# Patient Record
Sex: Female | Born: 1995 | Race: Black or African American | Hispanic: No | Marital: Single | Smoking: Never smoker
Health system: Southern US, Community
[De-identification: ages and names within clinical notes are randomized; demographics above are authoritative.]

## PROBLEM LIST (undated history)

## (undated) DIAGNOSIS — F32A Depression, unspecified: Secondary | ICD-10-CM

## (undated) DIAGNOSIS — J45909 Unspecified asthma, uncomplicated: Secondary | ICD-10-CM

## (undated) DIAGNOSIS — Z8249 Family history of ischemic heart disease and other diseases of the circulatory system: Secondary | ICD-10-CM

## (undated) DIAGNOSIS — F419 Anxiety disorder, unspecified: Secondary | ICD-10-CM

## (undated) DIAGNOSIS — Z8669 Personal history of other diseases of the nervous system and sense organs: Secondary | ICD-10-CM

## (undated) DIAGNOSIS — Z833 Family history of diabetes mellitus: Secondary | ICD-10-CM

## (undated) HISTORY — DX: Unspecified asthma, uncomplicated: J45.909

## (undated) HISTORY — DX: Depression, unspecified: F32.A

## (undated) HISTORY — DX: Personal history of other diseases of the nervous system and sense organs: Z86.69

## (undated) HISTORY — DX: Family history of ischemic heart disease and other diseases of the circulatory system: Z82.49

## (undated) HISTORY — DX: Anxiety disorder, unspecified: F41.9

## (undated) HISTORY — DX: Family history of diabetes mellitus: Z83.3

---

## 2021-02-19 ENCOUNTER — Encounter (HOSPITAL_BASED_OUTPATIENT_CLINIC_OR_DEPARTMENT_OTHER): Payer: Self-pay | Admitting: Nurse Practitioner

## 2021-02-19 ENCOUNTER — Other Ambulatory Visit (HOSPITAL_BASED_OUTPATIENT_CLINIC_OR_DEPARTMENT_OTHER): Admission: RE | Admit: 2021-02-19 | Payer: 59 | Source: Ambulatory Visit

## 2021-02-19 ENCOUNTER — Other Ambulatory Visit: Payer: Self-pay

## 2021-02-19 ENCOUNTER — Ambulatory Visit (HOSPITAL_BASED_OUTPATIENT_CLINIC_OR_DEPARTMENT_OTHER): Payer: 59 | Admitting: Nurse Practitioner

## 2021-02-19 VITALS — BP 97/61 | HR 82 | Ht 67.0 in | Wt 129.6 lb

## 2021-02-19 DIAGNOSIS — Z1329 Encounter for screening for other suspected endocrine disorder: Secondary | ICD-10-CM | POA: Insufficient documentation

## 2021-02-19 DIAGNOSIS — Z130 Encounter for screening for diseases of the blood and blood-forming organs and certain disorders involving the immune mechanism: Secondary | ICD-10-CM | POA: Insufficient documentation

## 2021-02-19 DIAGNOSIS — Z13228 Encounter for screening for other metabolic disorders: Secondary | ICD-10-CM

## 2021-02-19 DIAGNOSIS — Z7689 Persons encountering health services in other specified circumstances: Secondary | ICD-10-CM | POA: Insufficient documentation

## 2021-02-19 DIAGNOSIS — G4485 Primary stabbing headache: Secondary | ICD-10-CM

## 2021-02-19 DIAGNOSIS — Z113 Encounter for screening for infections with a predominantly sexual mode of transmission: Secondary | ICD-10-CM

## 2021-02-19 DIAGNOSIS — F419 Anxiety disorder, unspecified: Secondary | ICD-10-CM | POA: Insufficient documentation

## 2021-02-19 DIAGNOSIS — F32A Depression, unspecified: Secondary | ICD-10-CM | POA: Insufficient documentation

## 2021-02-19 DIAGNOSIS — Z0000 Encounter for general adult medical examination without abnormal findings: Secondary | ICD-10-CM

## 2021-02-19 DIAGNOSIS — Z1321 Encounter for screening for nutritional disorder: Secondary | ICD-10-CM

## 2021-02-19 LAB — HEPATITIS C ANTIBODY: HCV Ab: NONREACTIVE

## 2021-02-19 MED ORDER — SERTRALINE HCL 50 MG PO TABS: 50.0000 mg | ORAL_TABLET | Freq: Every day | ORAL | 3 refills | Status: DC

## 2021-02-19 NOTE — Patient Instructions (Addendum)
It sounds like you are having primary stabbing headaches (also called IcePick Headaches). These are not harmful and it appears that they are due to the nerves firing. It is thought that possibly having migraines makes you predisposed to these.   We will start with melatonin 54m at bedtime every night. If no improvement after one week, increase to 19mat bedtime every night. If you are still having the headaches after one week, then let me know and we will see if something a little stronger will work better for you.    We will start the sertraline (also called Zoloft) at 1/2 tab by mouth at bedtime for 4 days then increase to a full tab by mouth at bedtime. Take it every day so that it stays in your system and works best for you.   If the melatonin doesn't help you sleep, let me know and I will call in hydroxyzine for you to take as needed at bedtime.   If the sertraline is not working or you are having any side effects, let me know and we will change it up.     Information on diet, exercise, and health maintenance recommendations are listed below. This is information to help you be sure you are on track for optimal health and monitoring. Please look over this and let us[place]ow if you have any questions or if you have completed any of the health maintenance outside of [place] that we can be sure your records are up to date.  ___________________________________________________________  [facility]. I am excited for the opportunity to partner with you to meet your health care goals. It was a pleasure meeting you today!  I am an Adult-Geriatric Nurse Practitioner with a background in caring for patients for more than 20 years. I received my BaPaediatric nursen Nursing and my Doctor of Nursing Practice degrees [facility] [employer] received additional fellowship training in primary care and sports medicine after  receiving my doctorate degree. I provide primary care and sports medicine services to patients age 9957nd older within this office. I am also a provider with the [facility] the director of the APP Fellowship [facility] [place] called [place] for [place] 20 years and am proud to be a member of this community.   I am passionate about providing the best service to you through preventive medicine and supportive care. I consider you a part of the medical team and value your input. I work diligently to ensure that you are heard and your needs are met in a safe and effective manner. I want you to feel comfortable with me as your provider and want you to know that your health concerns are important to me.   For your information, our office hours are Monday- Friday 8:00 AM - 5:00 PM At this time I am not in the office on Wednesdays.  If you have questions or concerns, please call our office at [phone] send [place] MyChart message and we will respond as quickly as possible.   For all urgent or time sensitive needs we ask that you please call the office to avoid delays. MyChart is not constantly monitored and replies may take up to 72 business hours.  MyChart Policy: . MyChart allows for you to see your visit notes,  after visit summary, provider recommendations, lab and tests results, make an appointment, request refills, and contact your provider or the office for non-urgent questions or concerns.  . Providers are seeing patients during normal business hours and do not have built in time to review MyChart messages. We ask that you allow a minimum of 72 business hours for MyChart message responses.  . Complex MyChart concerns may require a visit. Your provider may request you schedule a virtual or in person visit to ensure we are providing the best care possible. . MyChart messages sent after 4:00 PM on  Friday will not be received by the provider until Monday morning.    Lab and Test Results: . You will receive your lab and test results on MyChart as soon as they are completed and results have been sent by the lab or testing facility. Due to this service, you will receive your results BEFORE your provider.  . Please allow a minimum of 72 business hours for your provider to receive and review lab and test results and contact you about.   . Most lab and test result comments from the provider will be sent through [facility]. Your provider may recommend changes to the plan of care, follow-up visits, repeat testing, ask questions, or request an office visit to discuss these results. You may reply directly to this message or call the office at [phone] to provide information for the provider or set up an appointment. . In some instances, you will be called with test results and recommendations. Please let us know if this is preferred and we will make note of this in your chart to provide this for you.    . If you have not heard a response to your lab or test results in 72 business hours, please call the office to let us know.   After Hours: . For all non-emergency after hours needs, please call the office at [phone] and select the option to reach the on-call provider service. On-call services are shared between multiple [facility] offices and therefore it will not be possible to speak directly with your provider. On-call providers may provide medical advice and recommendations, but are unable to provide refills for maintenance medications.  . For all emergency or urgent medical needs after normal business hours, we recommend that you seek care at the closest Urgent Care or Emergency Department to ensure appropriate treatment in a timely manner.  [facility] [place] has a 24 hour emergency room located on the ground floor for your convenience.    Please do not hesitate to reach out  to [place] with concerns.   Thank you, again, for choosing me as your health care partner. I appreciate your trust and look forward to learning more about you.   Worthy Keeler, DNP, AGNP-c ___________________________________________________________  Health Maintenance Recommendations Screening Testing  Mammogram  Every 1 -2 years based on history and risk factors  Starting at age 88  Pap Smear  Ages 21-39 every 3 years  Ages 38-65 every 5 years with HPV testing  More frequent testing may be required based on results and history  Colon Cancer Screening  Every 1-10 years based on test performed, risk factors, and history  Starting at age 65  Bone Density Screening  Every 2-10 years based on history  Starting at age 5 for women  Recommendations for men differ based on medication usage, history, and risk factors  AAA Screening  One time ultrasound  Men 65-75 years  old who have every smoked  Lung Cancer Screening  Low Dose Lung CT every 12 months  Age 42-80 years with a 30 pack-year smoking history who still smoke or who have quit within the last 15 years  Screening Labs  Routine  Labs: Complete Blood Count (CBC), Complete Metabolic Panel (CMP), Cholesterol (Lipid Panel)  Every 6-12 months based on history and medications  May be recommended more frequently based on current conditions or previous results  Hemoglobin A1c Lab  Every 3-12 months based on history and previous results  Starting at age 28 or earlier with diagnosis of diabetes, high cholesterol, BMI >26, and/or risk factors  Frequent monitoring for patients with diabetes to ensure blood sugar control  Thyroid Panel (TSH w/ T3 & T4)  Every 6 months based on history, symptoms, and risk factors  May be repeated more often if on medication  HIV  One time testing for all patients 50 and older  May be repeated more frequently for patients with increased risk factors or exposure  Hepatitis  C  One time testing for all patients 27 and older  May be repeated more frequently for patients with increased risk factors or exposure  Gonorrhea, Chlamydia  Every 12 months for all sexually active persons 13-24 years  Additional monitoring may be recommended for those who are considered high risk or who have symptoms  PSA  Men 44-42 years old with risk factors  Additional screening may be recommended from age 53-69 based on risk factors, symptoms, and history  Vaccine Recommendations  Tetanus Booster  All adults every 10 years  Flu Vaccine  All patients 6 months and older every year  COVID Vaccine  All patients 12 years and older  Initial dosing with booster  May recommend additional booster based on age and health history  HPV Vaccine  2 doses all patients age 8-26  Dosing may be considered for patients over 26  Shingles Vaccine (Shingrix)  2 doses all adults 59 years and older  Pneumonia (Pneumovax 23)  All adults 75 years and older  May recommend earlier dosing based on health history  Pneumonia (Prevnar 33)  All adults 76 years and older  Dosed 1 year after Pneumovax 23  Additional Screening, Testing, and Vaccinations may be recommended on an individualized basis based on family history, health history, risk factors, and/or exposure.  __________________________________________________________  Diet Recommendations for All Patients  I recommend that all patients maintain a diet low in saturated fats, carbohydrates, and cholesterol. While this can be challenging at first, it is not impossible and small changes can make big differences.  Things to try: Marland Kitchen Decreasing the amount of soda, sweet tea, and/or juice to one or less per day and replace with water o While water is always the first choice, if you do not like water you may consider - adding a water additive without sugar to improve the taste - other sugar free drinks . Replace potatoes with  a brightly colored vegetable at dinner . Use healthy oils, such as canola oil or olive oil, instead of butter or hard margarine . Limit your bread intake to two pieces or less a day . Replace regular pasta with low carb pasta options . Bake, broil, or grill foods instead of frying . Monitor portion sizes  . Eat smaller, more frequent meals throughout the day instead of large meals  An important thing to remember is, if you love foods that are not great for your health, you don't  have to give them up completely. Instead, allow these foods to be a reward when you have done well. Allowing yourself to still have special treats every once in a while is a nice way to tell yourself thank you for working hard to keep yourself healthy.   Also remember that every day is a new day. If you have a bad day and "fall off the wagon", you can still climb right back up and keep moving along on your journey!  We have resources available to help you!  Some websites that may be helpful include: . www.http://carter.biz/  . Www.VeryWellFit.com _____________________________________________________________  Activity Recommendations for All Patients  I recommend that all adults get at least 20 minutes of moderate physical activity that elevates your heart rate at least 5 days out of the week.  Some examples include: . Walking or jogging at a pace that allows you to carry on a conversation . Cycling (stationary bike or outdoors) . Water aerobics . Yoga . Weight lifting . Dancing If physical limitations prevent you from putting stress on your joints, exercise in a pool or seated in a chair are excellent options.  Do determine your MAXIMUM heart rate for activity: YOUR AGE - 220 = MAX HeartRate   Remember! . Do not push yourself too hard.  . Start slowly and build up your pace, speed, weight, time in exercise, etc.  . Allow your body to rest between exercise and get good sleep. . You will need more water than normal  when you are exerting yourself. Do not wait until you are thirsty to drink. Drink with a purpose of getting in at least 8, 8 ounce glasses of water a day plus more depending on how much you exercise and sweat.    If you begin to develop dizziness, chest pain, abdominal pain, jaw pain, shortness of breath, headache, vision changes, lightheadedness, or other concerning symptoms, stop the activity and allow your body to rest. If your symptoms are severe, seek emergency evaluation immediately. If your symptoms are concerning, but not severe, please let us know so that we can recommend further evaluation.   ________________________________________________________________
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
## 2021-02-21 ENCOUNTER — Encounter (HOSPITAL_BASED_OUTPATIENT_CLINIC_OR_DEPARTMENT_OTHER): Payer: Self-pay | Admitting: Nurse Practitioner

## 2021-02-21 DIAGNOSIS — Z0000 Encounter for general adult medical examination without abnormal findings: Secondary | ICD-10-CM | POA: Insufficient documentation

## 2021-02-21 DIAGNOSIS — Z113 Encounter for screening for infections with a predominantly sexual mode of transmission: Secondary | ICD-10-CM | POA: Insufficient documentation

## 2021-02-21 DIAGNOSIS — F32A Depression, unspecified: Secondary | ICD-10-CM | POA: Insufficient documentation

## 2021-02-21 DIAGNOSIS — Z13228 Encounter for screening for other metabolic disorders: Secondary | ICD-10-CM | POA: Insufficient documentation

## 2021-02-21 DIAGNOSIS — Z130 Encounter for screening for diseases of the blood and blood-forming organs and certain disorders involving the immune mechanism: Secondary | ICD-10-CM | POA: Insufficient documentation

## 2021-02-21 DIAGNOSIS — G4485 Primary stabbing headache: Secondary | ICD-10-CM | POA: Insufficient documentation

## 2021-02-21 DIAGNOSIS — Z7689 Persons encountering health services in other specified circumstances: Secondary | ICD-10-CM | POA: Insufficient documentation

## 2021-02-21 NOTE — Assessment & Plan Note (Signed)
Assessment: GAD7, 12 and PHQ-9, 13 today Sx consistent with worsening anxiety and depression No signs of SI/HI/Self harm today Discussion/Recommendations: Medication and therapy very effective for management of symptoms- recommend both for treatment Action of medication explained Side effects of medication explained Expectations for sx to improve explained Sx of depression may worsen initially, notify IMMEDIATELY if thoughts of self-harm or suicide occur Resources discussed Plan: Start sertraline 25mg  by mouth for 4 days then increase to 50mg  by mouth daily- bedtime administration helps with sleepiness at start of medication Utilize melatonin 5mg -12mg  for sleep and headache prevention F/U in 4 weeks or sooner, if needed

## 2021-02-21 NOTE — Assessment & Plan Note (Addendum)
Assessment: Normal PE with no concerning findings. Depression and anxiety symptoms present. Discussion/Recommendations: Labs today - will check A1c Diet and exercise recommendations provided Pap smear every 3 years until 30.  HPV vaccines help to prevent against certain viruses causing cervical and throat cancers- no sx may be present- 3 dose series recommended Flu shot annually, COVID booster, and 2 HPV vaccines needed Plan: STI testing and basic labs F/U for CPE in 1 year Flu shot in the fall Will think about pap- consider offering anxiolytic for procedure.

## 2021-02-21 NOTE — Assessment & Plan Note (Signed)
Assessment: Intermittent short lasting headache with severe stabbing quality to the top of the head with bilateral involvement.  No neurological deficits, vision changes, or warning symptoms present on exam. No warning sx present with episodes per pt. Recommendations/Discussion: Consistent with primary stabbing headache Cause of these are unknown, but often seen in females with history of migraines Treatment options include melatonin 5-12mg  at bedtime and/or indomethacin for prevention Monitor for weakness, dizziness, changes in mental status, difficulty walking/talking/swallowing, loss of consciousness and seek emergency care if these develop. Report these or any new symptoms immediately Plan: Start melatonin 5mg  at bedtime nightly- may increase up to 12mg - to prevent headaches If headaches still occur, please let me know F/U in 4 weeks for evaluation of treatment.

## 2021-02-21 NOTE — Assessment & Plan Note (Signed)
Review of current and past medical history, social history, medication, and family history.  Review of care gaps and health maintenance recommendations.  Records from recent providers to be requested if not available in Chart Review or Care Everywhere.  Recommendations for health maintenance, diet, and exercise provided.  Will determine if tdap is due.  Pt to think about HPV

## 2021-03-04 ENCOUNTER — Telehealth (HOSPITAL_BASED_OUTPATIENT_CLINIC_OR_DEPARTMENT_OTHER): Payer: Self-pay

## 2021-03-04 NOTE — Telephone Encounter (Signed)
-----   Message from Tollie Eth, NP sent at 03/04/2021 12:07 PM EDT ----- Hep C non-reactive.  No other results received.

## 2021-03-04 NOTE — Telephone Encounter (Signed)
Results released by Shawna Clamp, AGNP.  Called patient to discuss lab results.  Instructed patient to contact the office with any questions or concerns.

## 2021-03-04 NOTE — Progress Notes (Signed)
Hep C non-reactive.  No other results received.

## 2021-03-19 ENCOUNTER — Other Ambulatory Visit: Payer: Self-pay

## 2021-03-19 ENCOUNTER — Telehealth (INDEPENDENT_AMBULATORY_CARE_PROVIDER_SITE_OTHER): Payer: 59 | Admitting: Nurse Practitioner

## 2021-03-19 ENCOUNTER — Encounter (HOSPITAL_BASED_OUTPATIENT_CLINIC_OR_DEPARTMENT_OTHER): Payer: Self-pay | Admitting: Nurse Practitioner

## 2021-03-19 VITALS — Ht 67.0 in | Wt 128.8 lb

## 2021-03-19 DIAGNOSIS — F419 Anxiety disorder, unspecified: Secondary | ICD-10-CM

## 2021-03-19 DIAGNOSIS — F32A Depression, unspecified: Secondary | ICD-10-CM | POA: Diagnosis not present

## 2021-03-19 DIAGNOSIS — G4700 Insomnia, unspecified: Secondary | ICD-10-CM | POA: Diagnosis not present

## 2021-03-19 MED ORDER — HYDROXYZINE HCL 25 MG PO TABS: ORAL_TABLET | ORAL | 3 refills | Status: DC

## 2021-03-19 NOTE — Patient Instructions (Signed)
Lets plan to give the sertraline another 2 weeks to see if the sleepiness starts to wear off. You can also try taking the sertraline in the morning to see if there is a difference when dosing it this way.   Send me a message on MyChart if it is not any better.   I have sent in hydroxyzine for you to the pharmacy. You can take this about 30 minutes before bedtime- take 1 pill and if you don't feel the effects, you can take another after about 20 minutes. If you find that you are always needing 2 pills, it is ok to go ahead and take 2 each night. I sent enough medication to the pharmacy so you can do that.   If this doesn't work for you, please let me know.  If all is working well, we will plan to meet again in about 3 months to make sure that you are still feeling OK on the medication and make sure we don't need to make any changes.   If you have any questions or concerns, dont hesitate to reach out.

## 2021-03-19 NOTE — Progress Notes (Signed)
Virtual Video Visit via MyChart Note  I connected with  Mikayla Ortega on 03/19/21 at  1:10 PM EDT by the video enabled telemedicine application for , MyChart, and verified that I am speaking with the correct person using two identifiers.   I introduced myself as a Publishing rights manager with the practice. We discussed the limitations of evaluation and management by telemedicine and the availability of in person appointments. The patient expressed understanding and agreed to proceed.  Participating parties in this visit include: The patient and the nurse practitioner listed.  The patient is: At home I am: In the office  Subjective:    CC:  Chief Complaint  Patient presents with   Follow-up    Patient  states she has trouble sleeping at night; however, is experiencing sleepiness and being tired throughout the day.    HPI: Mikayla Ortega is a 25 y.o. year old female presenting today via MyChart today for F/U on mood and sleep.  At last visit was started on sertraline 50mg  daily and melatonin 5mg  at bedtime. She reports that she likes the sertraline    Past medical history, Surgical history, Family history not pertinant except as noted below, Social history, Allergies, and medications have been entered into the medical record, reviewed, and corrections made.   Review of Systems:  All review of systems negative except what is listed in the HPI   Objective:    General:  Speaking clearly in complete sentences. Absent shortness of breath noted.   Alert and oriented x3.   Normal judgment.  Absent acute distress.   Impression and Recommendations:    1. Anxiety and depression  2. Insomnia, unspecified type - hydrOXYzine (ATARAX/VISTARIL) 25 MG tablet; Take 1 - 2 tabs (25mg  - 50mg ) 30 minutes before bedtime for sleep.  Dispense: 60 tablet; Refill: 3  PLAN: Continue sertraline 50mg  per day Start Hydroxyzine 25-50mg  at bedtime for sleep.   Lets plan to give the sertraline  another 2 weeks to see if the sleepiness starts to wear off. You can also try taking the sertraline in the morning to see if there is a difference when dosing it this way.  Send me a message on MyChart if it is not any better.   I have sent in hydroxyzine for you to the pharmacy. You can take this about 30 minutes before bedtime- take 1 pill and if you don't feel the effects, you can take another after about 20 minutes. If you find that you are always needing 2 pills, it is ok to go ahead and take 2 each night. I sent enough medication to the pharmacy so you can do that.  If this doesn't work for you, please let me know.  If all is working well, we will plan to meet again in about 3 months to make sure that you are still feeling OK on the medication and make sure we don't need to make any changes.   If you have any questions or concerns, dont hesitate to reach out.     I discussed the assessment and treatment plan with the patient. The patient was provided an opportunity to ask questions and all were answered. The patient agreed with the plan and demonstrated an understanding of the instructions.   The patient was advised to call back or seek an in-person evaluation if the symptoms worsen or if the condition fails to improve as anticipated.  F/U in 3 months I provided 16 minutes of non-face-to-face interaction with  this MYCHART visit including intake, same-day documentation, and chart review.   Tollie Eth, NP

## 2021-03-31 ENCOUNTER — Encounter (HOSPITAL_BASED_OUTPATIENT_CLINIC_OR_DEPARTMENT_OTHER): Payer: Self-pay | Admitting: Nurse Practitioner

## 2021-06-13 ENCOUNTER — Encounter (HOSPITAL_BASED_OUTPATIENT_CLINIC_OR_DEPARTMENT_OTHER): Payer: Self-pay | Admitting: Nurse Practitioner

## 2021-06-13 DIAGNOSIS — N926 Irregular menstruation, unspecified: Secondary | ICD-10-CM

## 2021-06-23 ENCOUNTER — Encounter (HOSPITAL_BASED_OUTPATIENT_CLINIC_OR_DEPARTMENT_OTHER): Payer: Self-pay | Admitting: Nurse Practitioner

## 2021-06-23 ENCOUNTER — Other Ambulatory Visit: Payer: Self-pay

## 2021-06-23 ENCOUNTER — Ambulatory Visit (HOSPITAL_BASED_OUTPATIENT_CLINIC_OR_DEPARTMENT_OTHER): Payer: Managed Care, Other (non HMO) | Admitting: Nurse Practitioner

## 2021-06-23 VITALS — BP 107/72 | HR 88 | Ht 67.0 in | Wt 125.4 lb

## 2021-06-23 DIAGNOSIS — M542 Cervicalgia: Secondary | ICD-10-CM | POA: Diagnosis not present

## 2021-06-23 DIAGNOSIS — N926 Irregular menstruation, unspecified: Secondary | ICD-10-CM

## 2021-06-23 MED ORDER — PREDNISONE 20 MG PO TABS
20.0000 mg | ORAL_TABLET | Freq: Every day | ORAL | 0 refills | Status: DC
Start: 2021-06-23 — End: 2021-12-17

## 2021-06-23 MED ORDER — CYCLOBENZAPRINE HCL 5 MG PO TABS: 5.0000 mg | ORAL_TABLET | Freq: Three times a day (TID) | ORAL | 1 refills | Status: DC | PRN
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
## 2021-06-23 NOTE — Assessment & Plan Note (Deleted)
Mikayla Ortega is agreeable to monitor the symptoms at this time. She will call back and let us know if her next cycle is longer than her usual cycle of 6 days. We discussed taking Provera to help stop the cycle at that time.

## 2021-06-23 NOTE — Assessment & Plan Note (Addendum)
Right sided neck pain affecting sleep.  No known injury. No alarm sx present.  Will proceed with cervical x-ray and steroid burst today with at home exercises.  Script for flexeril provided . Will make changes to plan of care as necessary based on results.  F/U if sx worsen or fail to improve.
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
## 2021-06-23 NOTE — Assessment & Plan Note (Addendum)
Prolonged menses with no known provocation.  No tenderness present on exam. BS normal. No constipation.  Suspect symptoms are transient and discussed option for transvaginal US vs OCP vs watchful waiting.  She would like to monitor her cycle and will let us know if the bleeding lasts longer than 6-7 days. At that time, will consider 10mg  Provera for 10 days to help reset cycle.

## 2021-06-23 NOTE — Patient Instructions (Addendum)
We have sent a referral for an x-ray of your neck.  [address] [place].  [address]  [phone]  We have sent your prescription of predisone 20 mg for five days. We have also sent in Flexeril 5mg . You can take 1/2 to 1 tab as needed for severe pain.   Work on stretching exercises at least 3 times a day and continue to use ice and heat to the area to help with pain and swelling,   Let me know if this does not get better

## 2021-07-08 ENCOUNTER — Ambulatory Visit
Admission: RE | Admit: 2021-07-08 | Discharge: 2021-07-08 | Disposition: A | Discharge status: Home / Self Care | Payer: Managed Care, Other (non HMO) | Source: Ambulatory Visit | Attending: Nurse Practitioner | Admitting: Nurse Practitioner

## 2021-07-08 DIAGNOSIS — M542 Cervicalgia: Secondary | ICD-10-CM

## 2021-09-24 ENCOUNTER — Ambulatory Visit (HOSPITAL_BASED_OUTPATIENT_CLINIC_OR_DEPARTMENT_OTHER): Payer: Managed Care, Other (non HMO) | Admitting: Nurse Practitioner

## 2021-10-06 ENCOUNTER — Ambulatory Visit (HOSPITAL_BASED_OUTPATIENT_CLINIC_OR_DEPARTMENT_OTHER): Payer: Managed Care, Other (non HMO) | Admitting: Nurse Practitioner

## 2021-10-06 NOTE — Progress Notes (Deleted)
Patient not seen.

## 2021-10-15 ENCOUNTER — Encounter (HOSPITAL_BASED_OUTPATIENT_CLINIC_OR_DEPARTMENT_OTHER): Payer: Self-pay | Admitting: Nurse Practitioner

## 2021-12-15 NOTE — Progress Notes (Signed)
? ?  GYNECOLOGY OFFICE VISIT NOTE ? ?History:  ? Mikayla Ortega is a 26 y.o. G0 here today for - August 3 wk period. Normal ever since. No other symptoms. No cramping. It was lighter than her normal period. She had some time and that resumed.   ? ?No pain with sexual activity. Sexually active with women. Had pain with orgasm some time ago but none since (4 months ago). She uses pads.  ? ?Got some thermacare on that area and caused some discomfort. Took OTC meds and it helped.  ?  ?Past Medical History:  ?Diagnosis Date  ? Anxiety   ? Asthma   ? In childhood- resolved  ? Depression   ? Family history of cardiovascular disease   ? Family history of diabetes mellitus (DM)   ? History of migraine headaches   ? ? ?History reviewed. No pertinent surgical history. ? ?The following portions of the patient's history were reviewed and updated as appropriate: allergies, current medications, past family history, past medical history, past social history, past surgical history and problem list.  ? ?Health Maintenance:   ?No pap on file.  ? ?Review of Systems:  ?Pertinent items noted in HPI and remainder of comprehensive ROS otherwise negative. ? ?Physical Exam:  ?BP 116/78   Pulse 94   Ht 5\' 7"  (1.702 m)   Wt 123 lb (55.8 kg)   LMP 11/25/2021   BMI 19.26 kg/m?  ?CONSTITUTIONAL: Well-developed, well-nourished female in no acute distress.  ?HEENT:  Normocephalic, atraumatic. External right and left ear normal. No scleral icterus.  ?NECK: Normal range of motion, supple, no masses noted on observation ?SKIN: No rash noted. Not diaphoretic. No erythema. No pallor. ?MUSCULOSKELETAL: Normal range of motion. No edema noted. ?NEUROLOGIC: Alert and oriented to person, place, and time. Normal muscle tone coordination. No cranial nerve deficit noted. ?PSYCHIATRIC: Normal mood and affect. Normal behavior. Normal judgment and thought content. ? ?CARDIOVASCULAR: Normal heart rate noted ?RESPIRATORY: Effort and breath sounds normal, no  problems with respiration noted ?ABDOMEN: No masses noted. No other overt distention noted.   ? ?PELVIC: Normal appearing external genitalia; normal urethral meatus; normal appearing vaginal mucosa. Exam limited by hymenal tissue - only limited speculum exam done to collect pap and cultures. Performed in the presence of a chaperone ? ?Labs and Imaging ?No results found for this or any previous visit (from the past 168 hour(s)). ?No results found.  ?Assessment and Plan:  ? Mikayla Ortega was seen today for menstrual problem. ? ?Diagnoses and all orders for this visit: ? ?Abnormal uterine bleeding ?-     TSH ?-     Cytology - PAP( [facility]) ?-     Cervicovaginal ancillary only( ) ? ?- Bleeding is self limited and has seemed to resolve. Will do basic work up but she is otherwise low risk for anything abnormal. Pt will contact [facility] if it becomes a recurrent issue.  ?- Pap and cultures ?- [facility] d/t [facility]  ?- Gardasil #1 done in 2018. She will consider 2 more doses. Information given on Gardasil for her consideration.  ? ? ?Routine preventative health maintenance measures emphasized. ?Please refer to After Visit Summary for other counseling recommendations.  ? ?No follow-ups on file. ? ?2019, MD, FACOG ?Obstetrician Mil[facility] Hock, Faculty Practice ?[facility], [facility] ? ? ? ? ? ?

## 2021-12-16 ENCOUNTER — Telehealth: Payer: Self-pay | Admitting: *Deleted

## 2021-12-16 NOTE — Telephone Encounter (Signed)
Left patient an urgent message with all office information for scheduled appointment on 12/16/2021 at 2:10 with an 1:55 PM arrival time. ?

## 2021-12-17 ENCOUNTER — Ambulatory Visit: Payer: Managed Care, Other (non HMO) | Admitting: Obstetrics and Gynecology

## 2021-12-17 ENCOUNTER — Encounter: Payer: Self-pay | Admitting: Obstetrics and Gynecology

## 2021-12-17 ENCOUNTER — Other Ambulatory Visit (HOSPITAL_COMMUNITY)
Admission: RE | Admit: 2021-12-17 | Discharge: 2021-12-17 | Disposition: A | Discharge status: Home / Self Care | Payer: Managed Care, Other (non HMO) | Source: Ambulatory Visit | Attending: Obstetrics and Gynecology | Admitting: Obstetrics and Gynecology

## 2021-12-17 ENCOUNTER — Other Ambulatory Visit: Payer: Self-pay

## 2021-12-17 VITALS — BP 116/78 | HR 94 | Ht 67.0 in | Wt 123.0 lb

## 2021-12-17 DIAGNOSIS — N939 Abnormal uterine and vaginal bleeding, unspecified: Secondary | ICD-10-CM | POA: Insufficient documentation

## 2021-12-17 NOTE — Progress Notes (Signed)
Pt had a period from 06/01/21-06/20/21 ?Pt has had regular periods since ?Pt has never had pap smear ?

## 2021-12-18 LAB — CYTOLOGY - PAP: Diagnosis: NEGATIVE

## 2021-12-18 LAB — CERVICOVAGINAL ANCILLARY ONLY
Bacterial Vaginitis (gardnerella): POSITIVE — AB
Candida Glabrata: NEGATIVE
Candida Vaginitis: NEGATIVE
Chlamydia: NEGATIVE
Comment: NEGATIVE
Comment: NEGATIVE
Comment: NEGATIVE
Comment: NEGATIVE
Comment: NEGATIVE
Comment: NORMAL
Neisseria Gonorrhea: NEGATIVE
Trichomonas: NEGATIVE

## 2021-12-19 ENCOUNTER — Encounter: Payer: Self-pay | Admitting: Obstetrics and Gynecology

## 2021-12-21 ENCOUNTER — Other Ambulatory Visit: Payer: Self-pay | Admitting: *Deleted

## 2021-12-21 MED ORDER — METRONIDAZOLE 500 MG PO TABS: 500.0000 mg | ORAL_TABLET | Freq: Two times a day (BID) | ORAL | 0 refills | Status: DC

## 2021-12-21 NOTE — Progress Notes (Signed)
Pt is positive for BV and RX sent to her pharmacy.  Pt also wanting to know how to keep Ph in balance.  Probiotic daily was suggested. ?

## 2022-03-15 ENCOUNTER — Telehealth (HOSPITAL_BASED_OUTPATIENT_CLINIC_OR_DEPARTMENT_OTHER): Payer: Managed Care, Other (non HMO) | Admitting: Nurse Practitioner

## 2022-03-16 ENCOUNTER — Encounter (HOSPITAL_BASED_OUTPATIENT_CLINIC_OR_DEPARTMENT_OTHER): Payer: Self-pay | Admitting: Nurse Practitioner

## 2022-03-16 MED ORDER — SERTRALINE HCL 50 MG PO TABS: 50.0000 mg | ORAL_TABLET | Freq: Every day | ORAL | 3 refills | Status: DC

## 2022-03-25 ENCOUNTER — Telehealth (INDEPENDENT_AMBULATORY_CARE_PROVIDER_SITE_OTHER): Payer: Managed Care, Other (non HMO) | Admitting: Nurse Practitioner

## 2022-03-25 ENCOUNTER — Encounter (HOSPITAL_BASED_OUTPATIENT_CLINIC_OR_DEPARTMENT_OTHER): Payer: Self-pay

## 2022-03-25 DIAGNOSIS — F419 Anxiety disorder, unspecified: Secondary | ICD-10-CM | POA: Diagnosis not present

## 2022-03-25 DIAGNOSIS — F32A Depression, unspecified: Secondary | ICD-10-CM | POA: Diagnosis not present

## 2022-03-25 MED ORDER — SERTRALINE HCL 100 MG PO TABS: 100.0000 mg | ORAL_TABLET | Freq: Every day | ORAL | 3 refills | Status: DC

## 2022-03-25 MED ORDER — ALPRAZOLAM 0.25 MG PO TABS: 0.2500 mg | ORAL_TABLET | Freq: Two times a day (BID) | ORAL | 3 refills | Status: AC | PRN

## 2022-04-08 ENCOUNTER — Encounter (HOSPITAL_BASED_OUTPATIENT_CLINIC_OR_DEPARTMENT_OTHER): Payer: Self-pay | Admitting: Nurse Practitioner

## 2022-04-08 NOTE — Progress Notes (Signed)
Virtual Visit Encounter mychart visit.   I connected with  Mikayla Ortega on 04/08/22 at  2:10 PM EDT by secure video and audio telemedicine application. I verified that I am speaking with the correct person using two identifiers.   I introduced myself as a Publishing rights manager with the practice. The limitations of evaluation and management by telemedicine discussed with the patient and the availability of in person appointments. The patient expressed verbal understanding and consent to proceed.  Participating parties in this visit include: Myself and patient  The patient is: Patient Location: Home I am: Provider Location: Office/Clinic Subjective:    CC and HPI: Mikayla Ortega is a 26 y.o. year old female presenting for follow up of mood. Patient reports the following: Mikayla Ortega tells me today that she has been taking her sertraline routinely without missed doses.  She tells me that when she first started the medication and felt like was helping significantly but for a while now she feels like it is not working as well as it once was.  She tells me that she often times feels aggressive when her anxiety is elevated.  She would like to adjust the medication doses if possible.  She would like to continue to stay on the sertraline.  She is taking Xanax only as needed.  She does feel that this is helpful for her panic symptoms and would like to continue on this as needed if possible.  She denies SI/HI today.  Past medical history, Surgical history, Family history not pertinant except as noted below, Social history, Allergies, and medications have been entered into the medical record, reviewed, and corrections made.   Review of Systems:  All review of systems negative except what is listed in the HPI  Objective:    Alert and oriented x 4 Speaking in clear sentences with no shortness of breath. No distress.  Impression and Recommendations:    Problem List Items Addressed This Visit      Anxiety and depression - Primary    Initially symptoms significantly improved with sertraline 50 mg once a day.  Recently she has been experiencing increased symptoms of anxiety particularly.  She does feel that this medication is helpful for her and would like to continue on it but feels that an increased dose would be more effective.  Based on the patient's response from initial treatment doses I do feel that an increased dose is reasonable.  We will also continue with as needed Xanax up to twice a day only as needed for panic symptoms.  PDMP reviewed today.  No warning signs or alarm symptoms are present at this time.  No SI/HI reported.  Encouraged patient to monitor her symptoms closely and report immediately if she begins to notice new or worsening symptoms.  She is in agreement to this plan. We will plan to follow-up in 3 months with a virtual visit to see how she is doing or sooner if needed.      Relevant Medications   sertraline (ZOLOFT) 100 MG tablet   ALPRAZolam (XANAX) 0.25 MG tablet    orders and follow up as documented in EMR I discussed the assessment and treatment plan with the patient. The patient was provided an opportunity to ask questions and all were answered. The patient agreed with the plan and demonstrated an understanding of the instructions.   The patient was advised to call back or seek an in-person evaluation if the symptoms worsen or if the condition fails to improve  as anticipated.  Follow-Up: in 3 months  I provided 18 minutes of non-face-to-face interaction with this non face-to-face encounter including intake, same-day documentation, and chart review.   Tollie Eth, NP , DNP, AGNP-c [facility] at [facility] [phone] (fax)

## 2022-04-08 NOTE — Assessment & Plan Note (Signed)
Initially symptoms significantly improved with sertraline 50 mg once a day.  Recently she has been experiencing increased symptoms of anxiety particularly.  She does feel that this medication is helpful for her and would like to continue on it but feels that an increased dose would be more effective.  Based on the patient's response from initial treatment doses I do feel that an increased dose is reasonable.  We will also continue with as needed Xanax up to twice a day only as needed for panic symptoms.  PDMP reviewed today.  No warning signs or alarm symptoms are present at this time.  No SI/HI reported.  Encouraged patient to monitor her symptoms closely and report immediately if she begins to notice new or worsening symptoms.  She is in agreement to this plan. We will plan to follow-up in 3 months with a virtual visit to see how she is doing or sooner if needed.

## 2022-07-20 ENCOUNTER — Encounter (HOSPITAL_BASED_OUTPATIENT_CLINIC_OR_DEPARTMENT_OTHER): Payer: Self-pay | Admitting: Nurse Practitioner

## 2022-07-21 ENCOUNTER — Other Ambulatory Visit (HOSPITAL_BASED_OUTPATIENT_CLINIC_OR_DEPARTMENT_OTHER): Payer: Self-pay

## 2022-07-21 DIAGNOSIS — F419 Anxiety disorder, unspecified: Secondary | ICD-10-CM

## 2022-07-21 MED ORDER — SERTRALINE HCL 100 MG PO TABS: 100.0000 mg | ORAL_TABLET | Freq: Every day | ORAL | 3 refills | Status: AC

## 2022-10-11 IMAGING — CR DG CERVICAL SPINE COMPLETE 4+V
5 series · 5 of 5 positions shown · non-contrast
Comparison: None.

CLINICAL DATA: Neck pain.  Right shoulder pain.  No known injury.

EXAM:
CERVICAL SPINE - COMPLETE 4+ VIEW

[w c-spine lat]
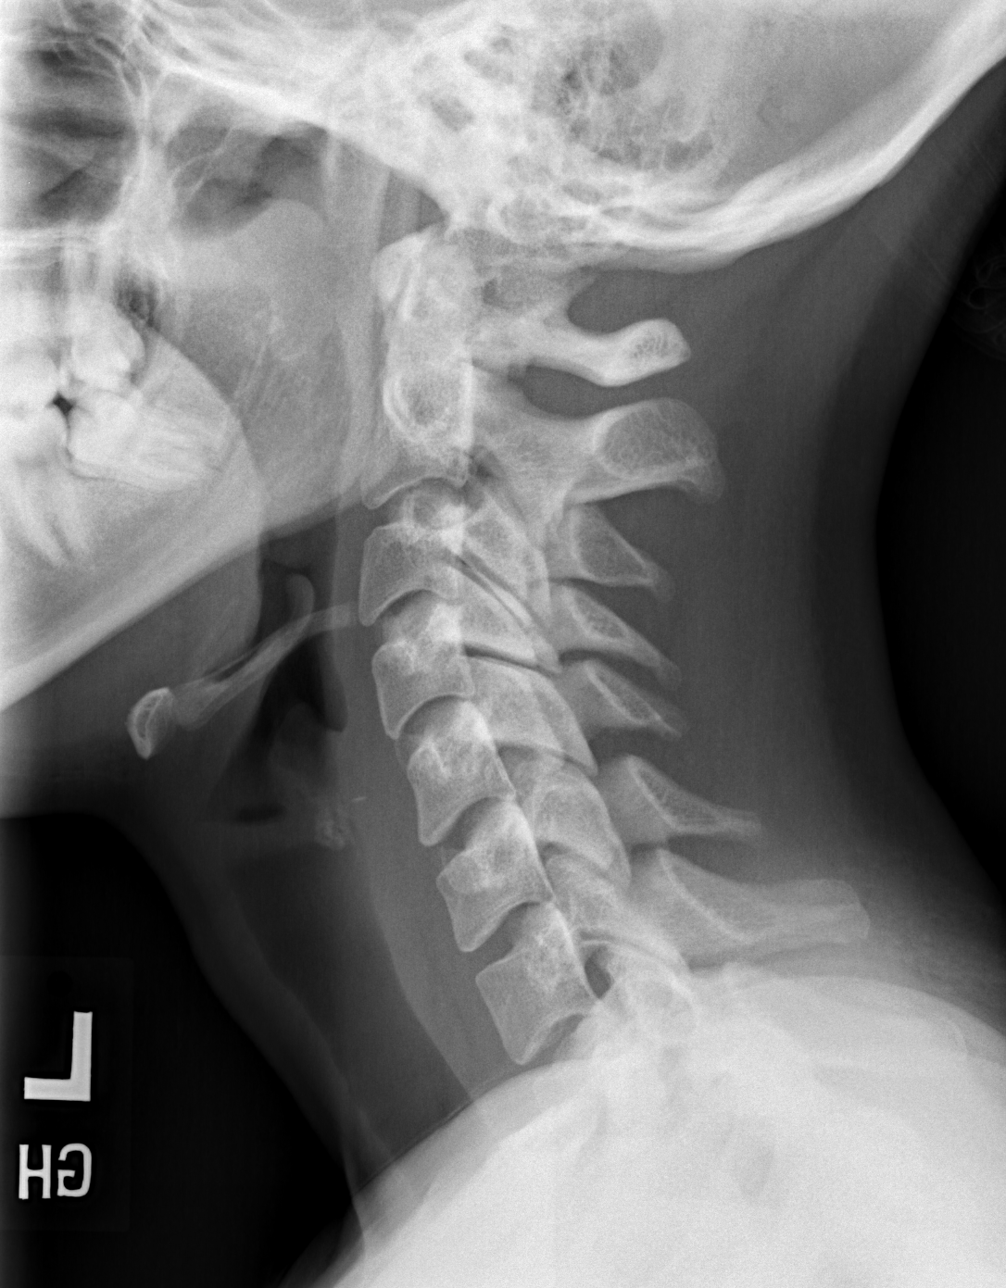

[w c-spine oblique (1 of 2)]
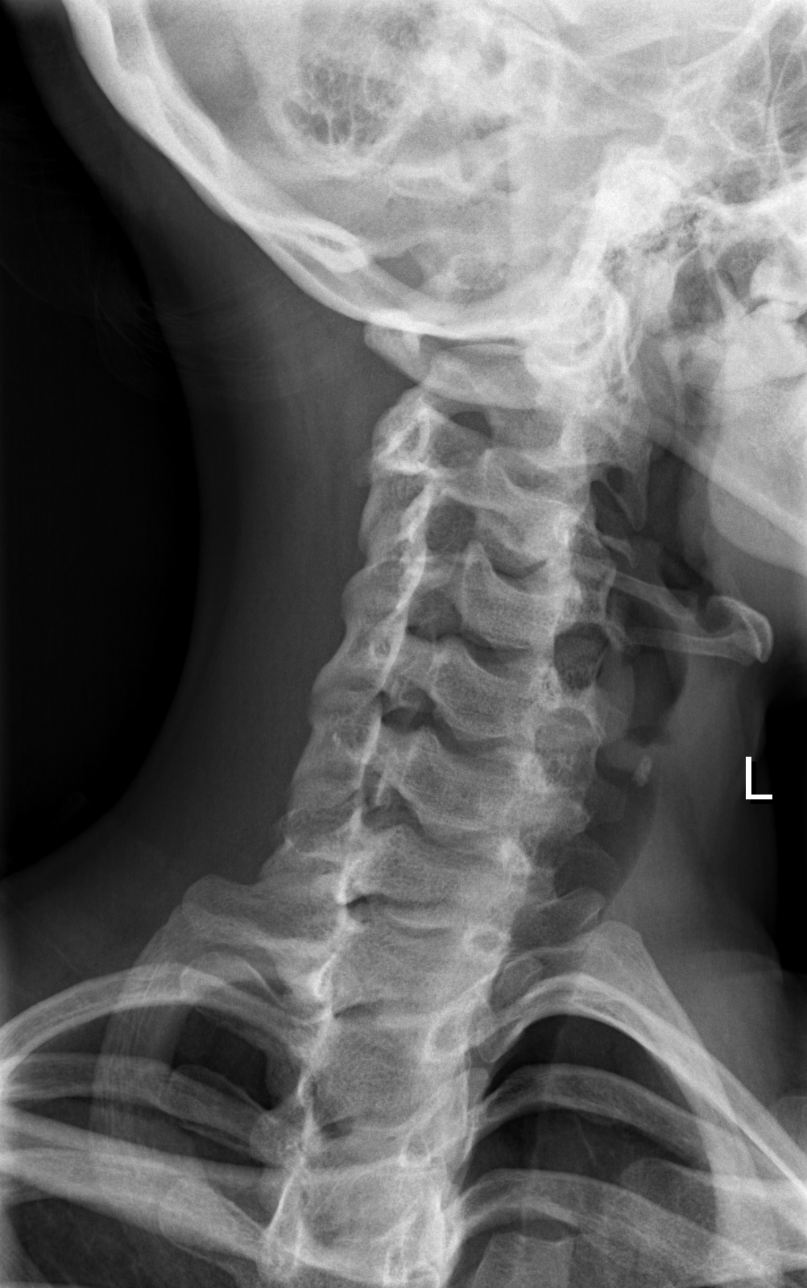

[w c-spine oblique (2 of 2)]
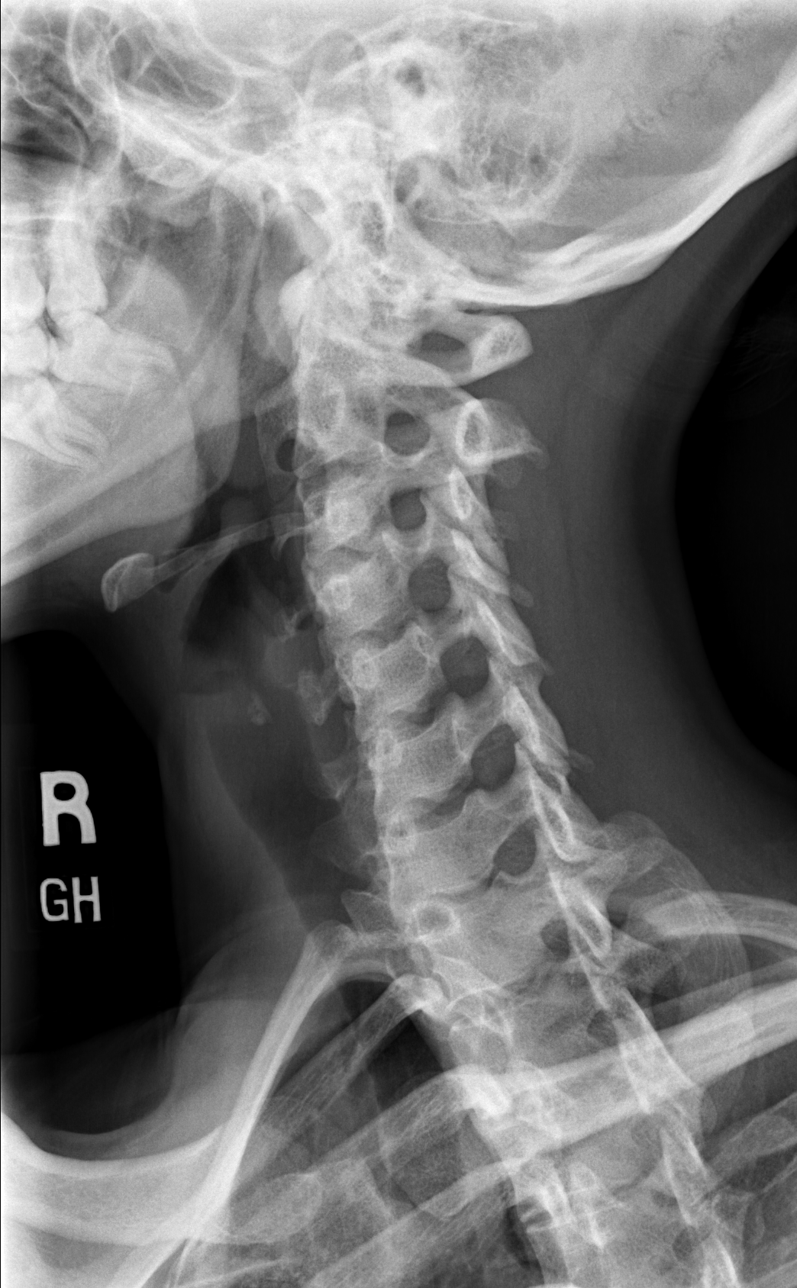

[w c-spine a.p. *]
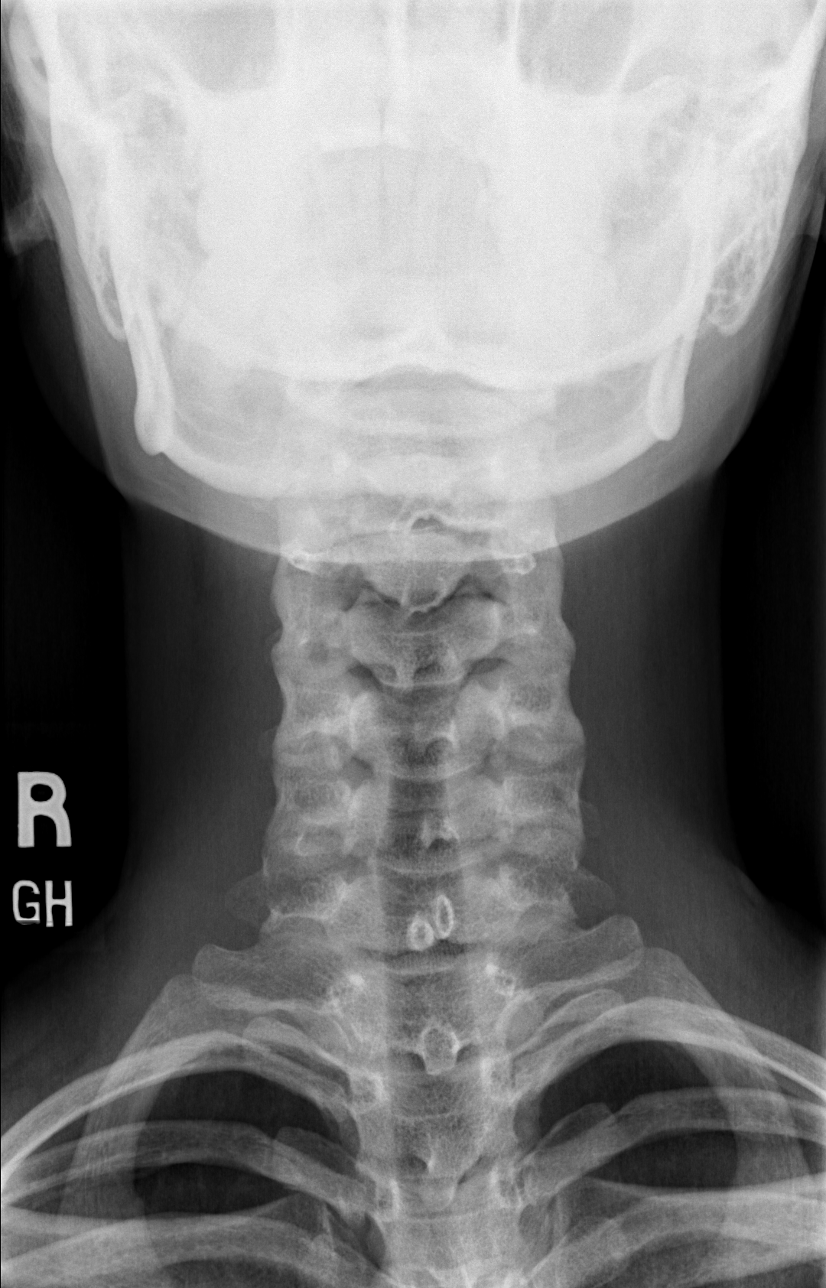

[w c-spine odontoid *]
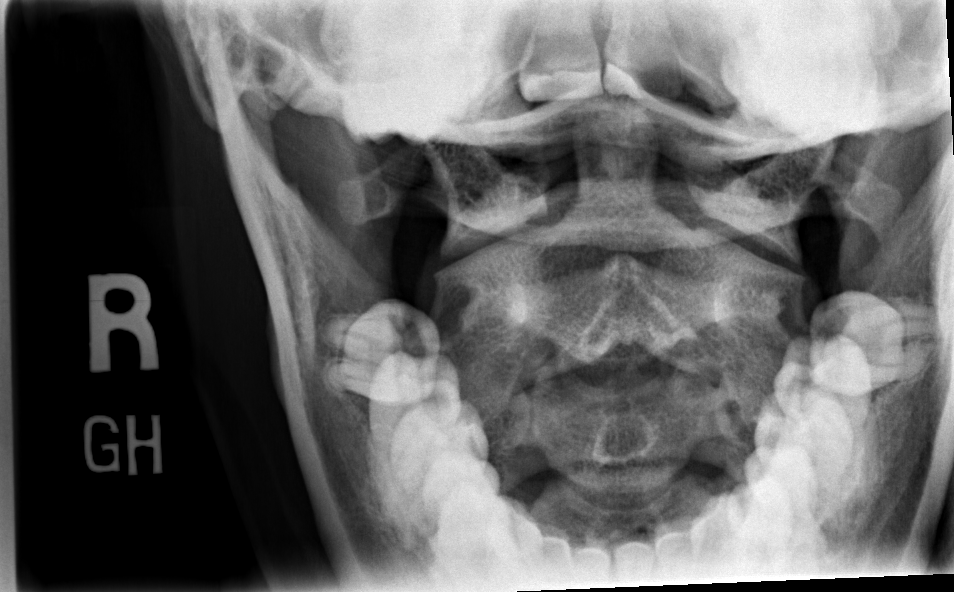

[5 of 5 positions shown; findings below may reference images not displayed]

FINDINGS: There is no evidence of cervical spine fracture or prevertebral soft
tissue swelling. Alignment is normal. No other significant bone
abnormalities are identified.
IMPRESSION: Negative cervical spine radiographs.

## 2023-02-14 ENCOUNTER — Ambulatory Visit: Payer: Managed Care, Other (non HMO) | Admitting: Family Medicine

## 2024-10-19 ENCOUNTER — Encounter: Payer: Self-pay | Admitting: Nurse Practitioner

## 2024-10-23 ENCOUNTER — Other Ambulatory Visit: Payer: Self-pay | Admitting: Medical Genetics

## 2024-10-24 ENCOUNTER — Ambulatory Visit
Admission: RE | Admit: 2024-10-24 | Discharge: 2024-10-24 | Disposition: A | Discharge status: Home / Self Care | Payer: Self-pay | Attending: Medical Genetics | Admitting: Medical Genetics

## 2024-10-24 DIAGNOSIS — Z006 Encounter for examination for normal comparison and control in clinical research program: Secondary | ICD-10-CM

## 2024-11-02 LAB — GENECONNECT MOLECULAR SCREEN: Genetic Analysis Overall Interpretation: NEGATIVE
